# Patient Record
Sex: Female | Born: 1935 | Race: Black or African American | Hispanic: No | Smoking: Never smoker
Health system: Southern US, Community
[De-identification: ages and names within clinical notes are randomized; demographics above are authoritative.]

## PROBLEM LIST (undated history)

## (undated) DIAGNOSIS — H269 Unspecified cataract: Secondary | ICD-10-CM

## (undated) DIAGNOSIS — D696 Thrombocytopenia, unspecified: Secondary | ICD-10-CM

## (undated) DIAGNOSIS — I10 Essential (primary) hypertension: Secondary | ICD-10-CM

## (undated) DIAGNOSIS — I639 Cerebral infarction, unspecified: Secondary | ICD-10-CM

## (undated) DIAGNOSIS — R569 Unspecified convulsions: Secondary | ICD-10-CM

## (undated) DIAGNOSIS — E7800 Pure hypercholesterolemia, unspecified: Secondary | ICD-10-CM

## (undated) HISTORY — PX: JOINT REPLACEMENT: SHX530

## (undated) HISTORY — PX: CATARACT EXTRACTION, BILATERAL: SHX1313

## (undated) HISTORY — PX: CAROTID ENDARTERECTOMY: SUR193

---

## 2014-06-09 ENCOUNTER — Emergency Department (HOSPITAL_COMMUNITY)
Admission: EM | Admit: 2014-06-09 | Discharge: 2014-06-09 | Disposition: A | Discharge status: Home / Self Care | Payer: Medicare Other | Attending: Emergency Medicine | Admitting: Emergency Medicine

## 2014-06-09 ENCOUNTER — Encounter (HOSPITAL_COMMUNITY): Payer: Self-pay | Admitting: Emergency Medicine

## 2014-06-09 ENCOUNTER — Emergency Department (HOSPITAL_COMMUNITY): Payer: Medicare Other

## 2014-06-09 DIAGNOSIS — Z9849 Cataract extraction status, unspecified eye: Secondary | ICD-10-CM | POA: Insufficient documentation

## 2014-06-09 DIAGNOSIS — Z862 Personal history of diseases of the blood and blood-forming organs and certain disorders involving the immune mechanism: Secondary | ICD-10-CM | POA: Insufficient documentation

## 2014-06-09 DIAGNOSIS — Z7982 Long term (current) use of aspirin: Secondary | ICD-10-CM | POA: Diagnosis not present

## 2014-06-09 DIAGNOSIS — Z880 Allergy status to penicillin: Secondary | ICD-10-CM | POA: Insufficient documentation

## 2014-06-09 DIAGNOSIS — E7800 Pure hypercholesterolemia, unspecified: Secondary | ICD-10-CM | POA: Insufficient documentation

## 2014-06-09 DIAGNOSIS — Z79899 Other long term (current) drug therapy: Secondary | ICD-10-CM | POA: Diagnosis not present

## 2014-06-09 DIAGNOSIS — G40909 Epilepsy, unspecified, not intractable, without status epilepticus: Secondary | ICD-10-CM | POA: Insufficient documentation

## 2014-06-09 DIAGNOSIS — F0390 Unspecified dementia without behavioral disturbance: Secondary | ICD-10-CM | POA: Diagnosis not present

## 2014-06-09 DIAGNOSIS — Z8673 Personal history of transient ischemic attack (TIA), and cerebral infarction without residual deficits: Secondary | ICD-10-CM | POA: Diagnosis not present

## 2014-06-09 DIAGNOSIS — R079 Chest pain, unspecified: Secondary | ICD-10-CM | POA: Insufficient documentation

## 2014-06-09 DIAGNOSIS — I10 Essential (primary) hypertension: Secondary | ICD-10-CM | POA: Insufficient documentation

## 2014-06-09 HISTORY — DX: Pure hypercholesterolemia, unspecified: E78.00

## 2014-06-09 HISTORY — DX: Unspecified convulsions: R56.9

## 2014-06-09 HISTORY — DX: Unspecified cataract: H26.9

## 2014-06-09 HISTORY — DX: Essential (primary) hypertension: I10

## 2014-06-09 HISTORY — DX: Thrombocytopenia, unspecified: D69.6

## 2014-06-09 HISTORY — DX: Cerebral infarction, unspecified: I63.9

## 2014-06-09 LAB — CBC WITH DIFFERENTIAL/PLATELET
Basophils Absolute: 0 10*3/uL (ref 0.0–0.1)
Basophils Relative: 0 % (ref 0–1)
Eosinophils Absolute: 0.1 10*3/uL (ref 0.0–0.7)
Eosinophils Relative: 3 % (ref 0–5)
HCT: 42.2 % (ref 36.0–46.0)
HEMOGLOBIN: 14.1 g/dL (ref 12.0–15.0)
LYMPHS ABS: 1.9 10*3/uL (ref 0.7–4.0)
LYMPHS PCT: 40 % (ref 12–46)
MCH: 33.2 pg (ref 26.0–34.0)
MCHC: 33.4 g/dL (ref 30.0–36.0)
MCV: 99.3 fL (ref 78.0–100.0)
Monocytes Absolute: 0.6 10*3/uL (ref 0.1–1.0)
Monocytes Relative: 13 % — ABNORMAL HIGH (ref 3–12)
NEUTROS PCT: 44 % (ref 43–77)
Neutro Abs: 2.1 10*3/uL (ref 1.7–7.7)
Platelets: 386 10*3/uL (ref 150–400)
RBC: 4.25 MIL/uL (ref 3.87–5.11)
RDW: 14.4 % (ref 11.5–15.5)
WBC: 4.7 10*3/uL (ref 4.0–10.5)

## 2014-06-09 LAB — BASIC METABOLIC PANEL
Anion gap: 15 (ref 5–15)
BUN: 14 mg/dL (ref 6–23)
CO2: 28 meq/L (ref 19–32)
Calcium: 9.9 mg/dL (ref 8.4–10.5)
Chloride: 96 mEq/L (ref 96–112)
Creatinine, Ser: 1.13 mg/dL — ABNORMAL HIGH (ref 0.50–1.10)
GFR calc Af Amer: 50 mL/min — ABNORMAL LOW (ref >90)
GFR calc non Af Amer: 43 mL/min — ABNORMAL LOW (ref >90)
GLUCOSE: 107 mg/dL — AB (ref 70–99)
POTASSIUM: 3.4 meq/L — AB (ref 3.7–5.3)
SODIUM: 139 meq/L (ref 137–147)

## 2014-06-09 LAB — I-STAT TROPONIN, ED: TROPONIN I, POC: 0 ng/mL (ref 0.00–0.08)

## 2014-06-09 LAB — TROPONIN I

## 2014-06-09 NOTE — ED Notes (Signed)
Per [facility], pt was in a parked car with the air on and started having cp when her daughters came out to check on her. Pt sees MD in [place] for frequent cp and goes home. Family will be coming up here. Pt is very non-descriptive of the pain and states it hurts a little. Was given 324 mg ASA and 1 NTG.

## 2014-06-09 NOTE — ED Notes (Signed)
Family at the bedside. Phlebotomy at the bedside.

## 2014-06-09 NOTE — ED Provider Notes (Signed)
I saw and evaluated the patient, reviewed the resident's note and I agree with the findings and plan.   EKG Interpretation   Date/Time:  Thursday June 09 2014 18:40:11 EDT Ventricular Rate:  81 PR Interval:  239 QRS Duration: 82 QT Interval:  381 QTC Calculation: 442 R Axis:   -19 Text Interpretation:  Sinus rhythm Prolonged PR interval Borderline left  axis deviation No previous ECGs available Confirmed by YAO  MD, DAVID  ([id number]) on 06/09/2014 6:50:53 PM      Bridget Thompson is a 80 y.o. female hx of CAD here with chest pain. Was getting into a car and had substernal chest pain. Patient demented and unable to give much history. [place]. Records reviewed and had 2D echo in Dec 2014 with nl EF. Patient wants to go home. Cardiac and lung exam unremarkable. Delta trop neg. Stable for d/c.   Level V caveat- dementia    Richardean Canalavid H Yao, MD 06/09/14 2336

## 2014-06-09 NOTE — Discharge Instructions (Signed)

## 2014-06-09 NOTE — ED Notes (Signed)
Resident at the bedside

## 2014-06-09 NOTE — ED Provider Notes (Signed)
CSN: [id number]     Arrival date & time 06/09/14  1833 History   First MD Initiated Contact with Patient 06/09/14 1844     Chief Complaint  Patient presents with  . Chest Pain     (Consider location/radiation/quality/duration/timing/severity/associated sxs/prior Treatment) Patient is a 80 y.o. female presenting with chest pain. The history is provided by the patient, the EMS personnel and a relative.  Chest Pain Pain location:  Substernal area Pain quality: dull and sharp   Pain radiates to:  Does not radiate Pain radiates to the back: no   Pain severity:  Severe Onset quality:  Sudden Duration:  20 minutes Timing:  Constant Progression:  Resolved Chronicity:  Recurrent Context: at rest   Context: not breathing   Relieved by:  Nothing Worsened by:  Nothing tried Ineffective treatments:  None tried Associated symptoms: no dizziness, no fever, no headache, no nausea, no palpitations, no shortness of breath and not vomiting    80 -year-old female comes in with a chief complaint of substernal chest pain. Per patient this started a couple hours ago. The pain had resolved. Patient has a history of this happening per family. Patient has a history of a ischemic stroke. Patient with dementia underlying. Patient poor historian unable to describe characterization of pain described radiation unsure of timing. Past Medical History  Diagnosis Date  . Hypertension   . Stroke   . Seizures   . Cataracts, bilateral   . Hypercholesteremia   . Thrombocytopenia    Past Surgical History  Procedure Laterality Date  . Joint replacement Right     hip  . Cataract extraction, bilateral    . Carotid endarterectomy     No family history on file. History  Substance Use Topics  . Smoking status: Never Smoker   . Smokeless tobacco: Not on file  . Alcohol Use: No   OB History   Grav Para Term Preterm Abortions TAB SAB Ect Mult Living                 Review of Systems  Unable to perform ROS:  Dementia  Constitutional: Negative for fever and chills.  HENT: Negative for congestion and rhinorrhea.   Eyes: Negative for redness and visual disturbance.  Respiratory: Negative for shortness of breath and wheezing.   Cardiovascular: Negative for chest pain and palpitations.  Gastrointestinal: Negative for nausea and vomiting.  Genitourinary: Negative for dysuria and urgency.  Musculoskeletal: Negative for arthralgias and myalgias.  Skin: Negative for pallor and wound.  Neurological: Negative for dizziness and headaches.      Allergies  Boniva; Lipitor; Lisinopril; and Penicillins  Home Medications   Prior to Admission medications   Medication Sig Start Date End Date Taking? Authorizing Provider  aspirin 325 MG tablet Take 325 mg by mouth daily.   Yes Historical Provider, MD  Cholecalciferol (VITAMIN D-3) 1000 UNITS CAPS Take 1,000 Units by mouth daily.   Yes Historical Provider, MD  hydrochlorothiazide (HYDRODIURIL) 25 MG tablet Take 12.5 mg by mouth daily.   Yes Historical Provider, MD  lamoTRIgine (LAMICTAL) 25 MG tablet Take 50-62.5 mg by mouth 2 (two) times daily. Takes 50mg  (2 tabs) in am, takes 2.5 tabs (62.5mg ) in pm   Yes Historical Provider, MD  metoprolol (LOPRESSOR) 50 MG tablet Take 50 mg by mouth 2 (two) times daily.   Yes Historical Provider, MD  Omega-3 Fatty Acids (FISH OIL) 1000 MG CAPS Take 1,000 mg by mouth 2 (two) times daily.   Yes Historical  Provider, MD  simvastatin (ZOCOR) 40 MG tablet Take 40 mg by mouth at bedtime.   Yes Historical Provider, MD  venlafaxine (EFFEXOR) 37.5 MG tablet Take 37.5 mg by mouth 2 (two) times daily.   Yes Historical Provider, MD   BP 149/69  Pulse 60  Temp(Src) 97.3 F (36.3 C) (Oral)  Resp 12  SpO2 96% Physical Exam  Constitutional: She appears well-developed and well-nourished. No distress.  HENT:  Head: Normocephalic and atraumatic.  Eyes: EOM are normal. Pupils are equal, round, and reactive to light.  Neck: Normal  range of motion. Neck supple.  Cardiovascular: Normal rate and regular rhythm.  Exam reveals no gallop and no friction rub.   No murmur heard. Pulmonary/Chest: Effort normal. She has no wheezes. She has no rales.  Abdominal: Soft. She exhibits no distension. There is no tenderness. There is no rebound.  Musculoskeletal: She exhibits no edema and no tenderness.  Neurological: She is alert. GCS eye subscore is 4. GCS verbal subscore is 4. GCS motor subscore is 6.  Skin: Skin is warm and dry. She is not diaphoretic.  Psychiatric: She has a normal mood and affect. Her behavior is normal.    ED Course  Procedures (including critical care time) Labs Review Labs Reviewed  CBC WITH DIFFERENTIAL - Abnormal; Notable for the following:    Monocytes Relative 13 (*)    All other components within normal limits  BASIC METABOLIC PANEL - Abnormal; Notable for the following:    Potassium 3.4 (*)    Glucose, Bld 107 (*)    Creatinine, Ser 1.13 (*)    GFR calc non Af Amer 43 (*)    GFR calc Af Amer 50 (*)    All other components within normal limits  TROPONIN I  I-STAT TROPOININ, ED    Imaging Review Dg Chest Portable 1 View  06/09/2014   CLINICAL DATA:  Chest pain.  EXAM: PORTABLE CHEST - 1 VIEW  COMPARISON:  None.  FINDINGS: There is haziness at the left lung base. Heart size is normal. There is tortuosity of the thoracic aorta. Right lung is clear. No effusions.  IMPRESSION: Haziness at the left base is most likely secondary to a shallow inspiration. I think this is less likely to represent faint infiltrate.   Electronically Signed   By: Geanie Cooley M.D.   On: 06/09/2014 19:19     EKG Interpretation   Date/Time:  Thursday June 09 2014 18:40:11 EDT Ventricular Rate:  81 PR Interval:  239 QRS Duration: 82 QT Interval:  381 QTC Calculation: 442 R Axis:   -19 Text Interpretation:  Sinus rhythm Prolonged PR interval Borderline left  axis deviation No previous ECGs available Confirmed by YAO   MD, DAVID  ([id number]) on 06/09/2014 6:50:53 PM      MDM   Final diagnoses:  Chest pain, unspecified chest pain type    79 -year-old female with a chief complaint of chest pain. History of ACS no unknown per family. 2-D echo done in December of 2014 with preserved ejection fraction of 50%.. Delta troponin. Currently pain-free.   Patient with negative delta troponin. Patient's chest x-ray with some haziness in the left base. Feel unlikely to be pneumonia without cough, fever.    11:01 PM:  I have discussed the diagnosis/risks/treatment options with the patient and family and believe the pt to be eligible for discharge home to follow-up with PCP, Cardiologist. We also discussed returning to the ED immediately if new or worsening sx occur.  We discussed the sx which are most concerning (e.g., sudden chest pain) that necessitate immediate return. Medications administered to the patient during their visit and any new prescriptions provided to the patient are listed below.  Medications given during this visit Medications - No data to display  New Prescriptions   No medications on file     Melene Plan, MD 06/09/14 2301

## 2016-03-12 IMAGING — CR DG CHEST 1V PORT
1 series · 1 of 1 positions shown · non-contrast
Comparison: None.

CLINICAL DATA: Chest pain.

EXAM:
PORTABLE CHEST - 1 VIEW

[AP]
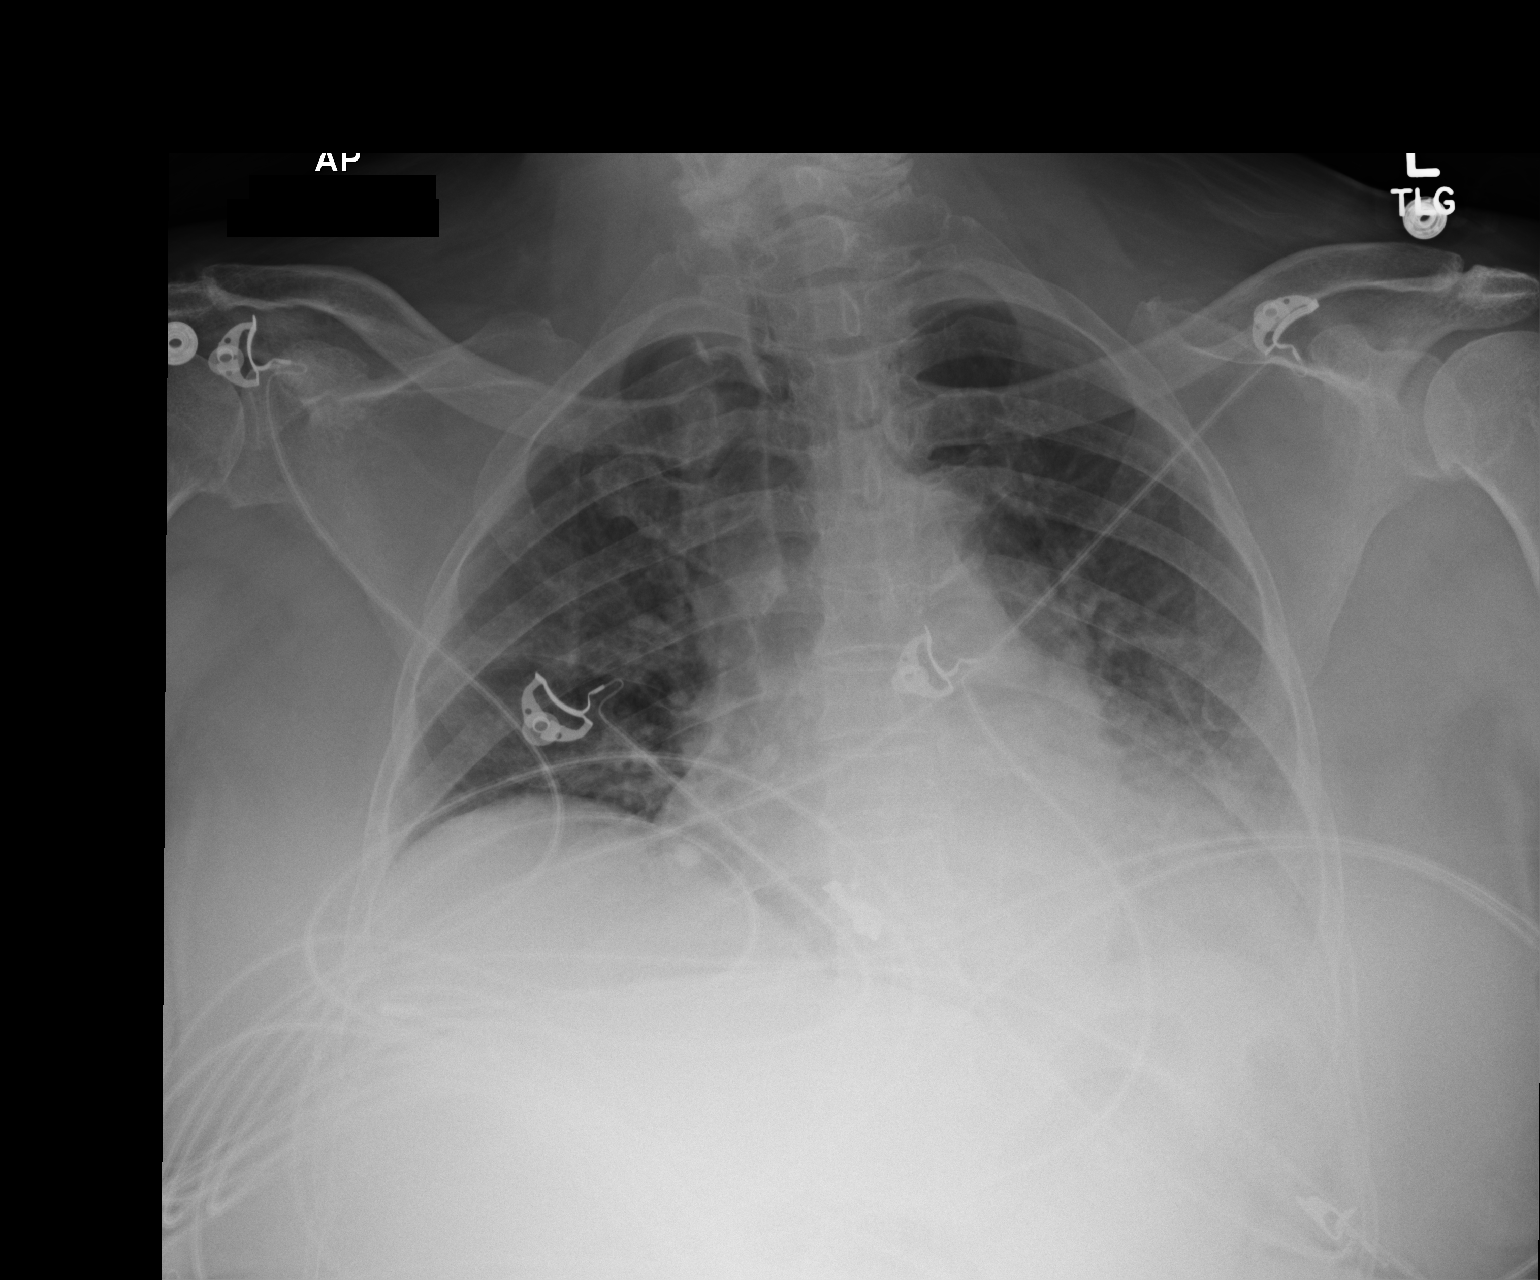

[1 of 1 positions shown; findings below may reference images not displayed]

FINDINGS: There is haziness at the left lung base. Heart size is normal. There
is tortuosity of the thoracic aorta. Right lung is clear. No
effusions.
IMPRESSION: Haziness at the left base is most likely secondary to a shallow
inspiration. I think this is less likely to represent faint
infiltrate.
